# Patient Record
Sex: Male | Born: 1985
Health system: Southern US, Community
[De-identification: ages and names within clinical notes are randomized; demographics above are authoritative.]

---

## 2010-06-25 ENCOUNTER — Emergency Department (HOSPITAL_COMMUNITY): Admission: EM | Admit: 2010-06-25 | Discharge: 2010-06-25 | Payer: Self-pay | Admitting: Emergency Medicine

## 2011-02-22 LAB — POCT I-STAT, CHEM 8
BUN: 9 mg/dL (ref 6–23)
Calcium, Ion: 1.09 mmol/L — ABNORMAL LOW (ref 1.12–1.32)
Chloride: 104 meq/L (ref 96–112)
Creatinine, Ser: 1.1 mg/dL (ref 0.4–1.5)
Glucose, Bld: 103 mg/dL — ABNORMAL HIGH (ref 70–99)
HCT: 46 % (ref 39.0–52.0)
Hemoglobin: 15.6 g/dL (ref 13.0–17.0)
Potassium: 2.7 meq/L — CL (ref 3.5–5.1)
Sodium: 140 meq/L (ref 135–145)
TCO2: 24 mmol/L (ref 0–100)

## 2011-02-22 LAB — POTASSIUM: Potassium: 3.2 mEq/L — ABNORMAL LOW (ref 3.5–5.1)

## 2011-11-03 IMAGING — CR DG CHEST 2V
2 series · 2 of 2 positions shown · non-contrast
Comparison: None.

CLINICAL DATA: Shortness of breath.  Cardiac palpitations.

CHEST - 2 VIEW

[w chest pa]
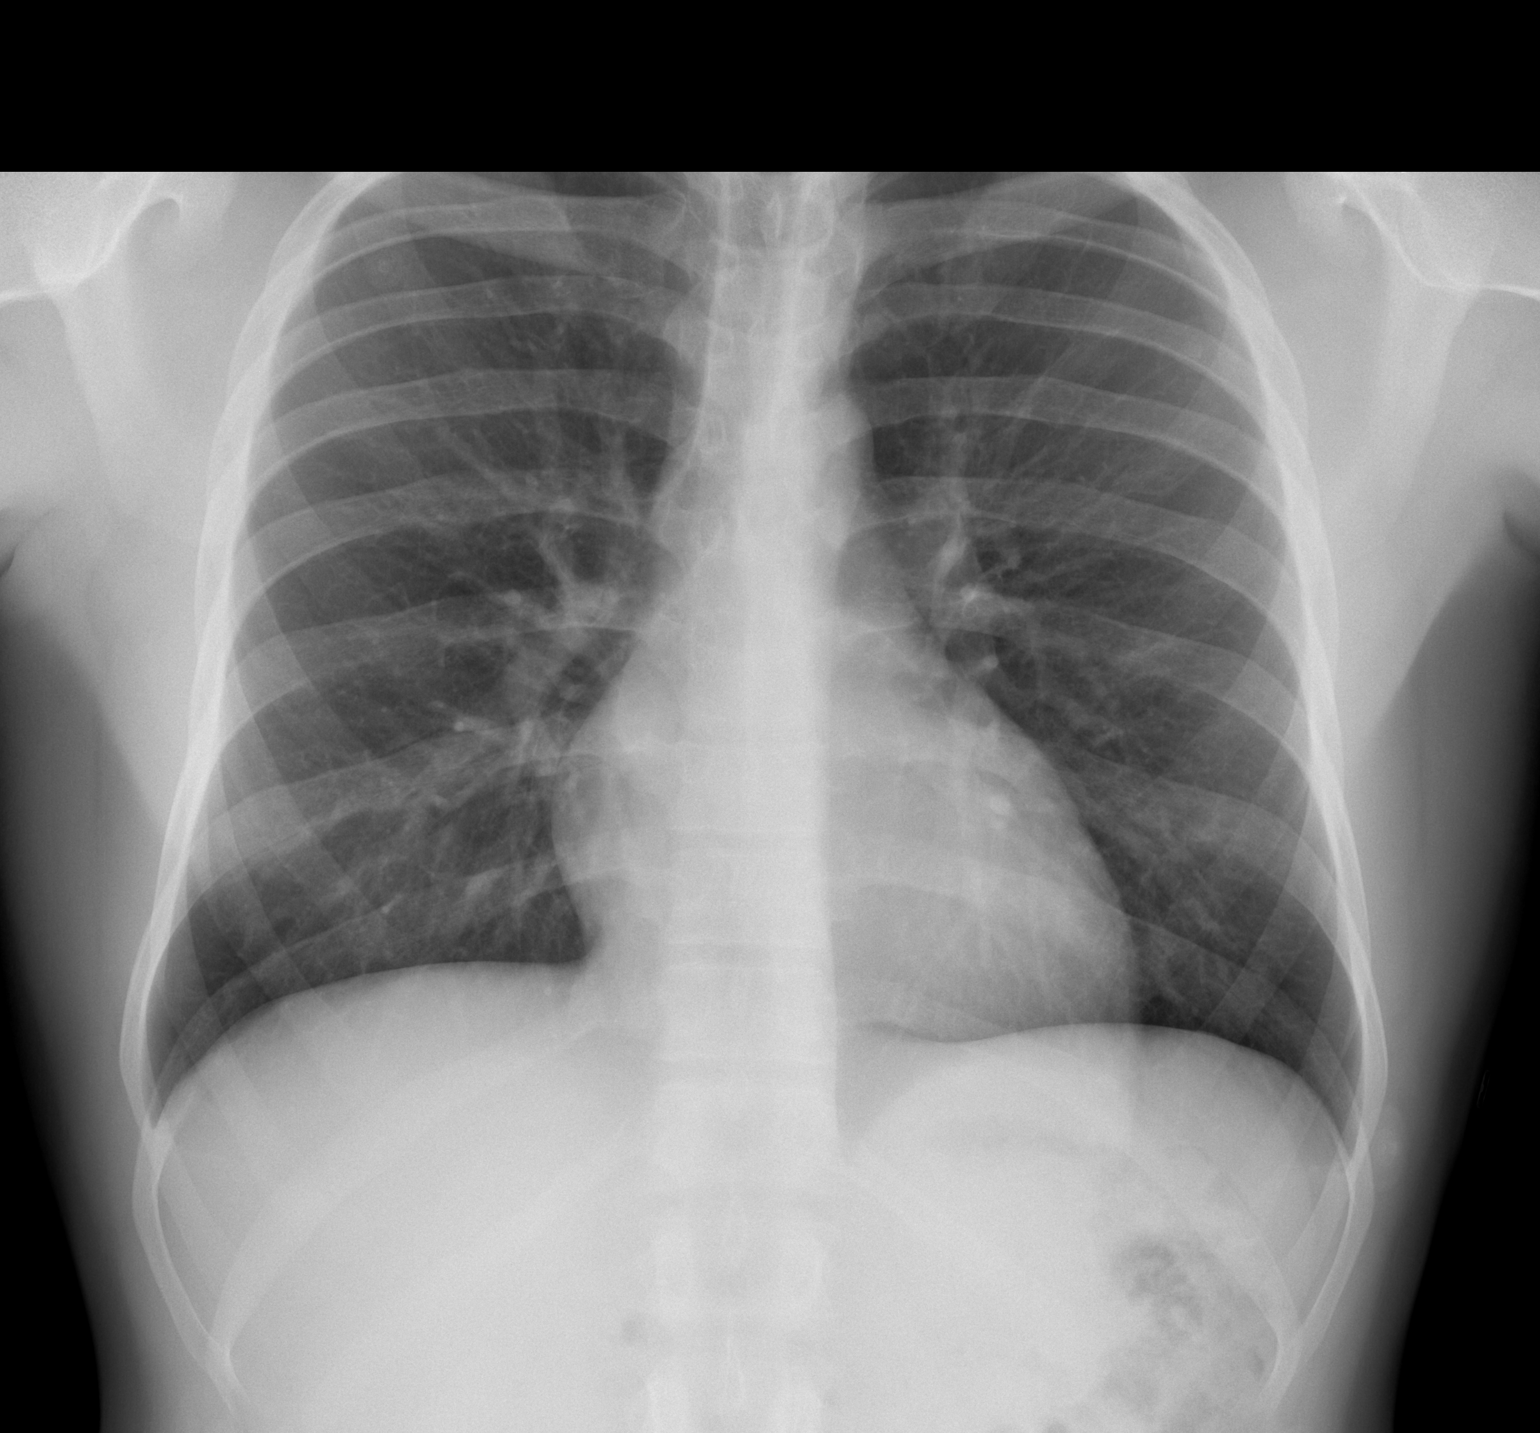

[w chest lat]
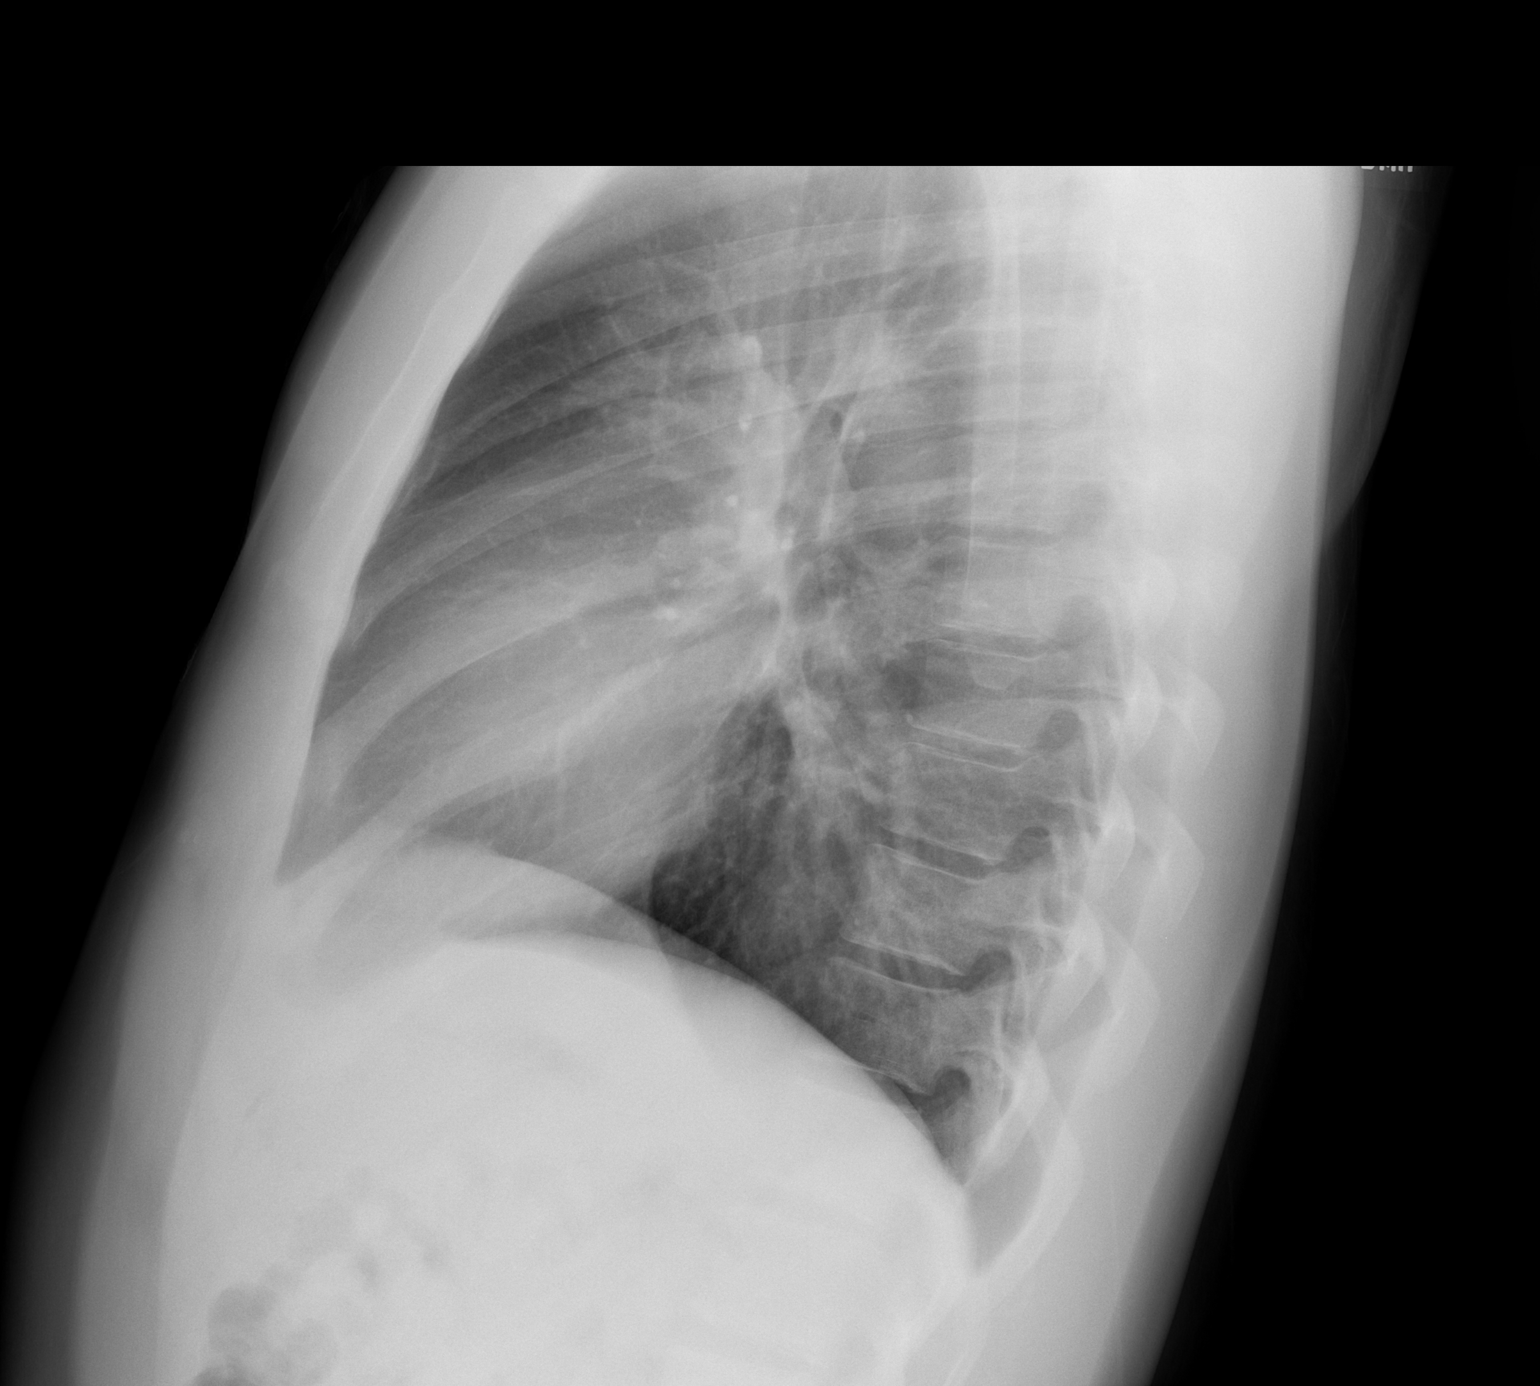

[2 of 2 positions shown; findings below may reference images not displayed]

FINDINGS: Cardiac and mediastinal contours appear normal.

The lungs appear clear.

No pleural effusion is identified.
IMPRESSION: No significant abnormality identified.

## 2013-03-19 ENCOUNTER — Emergency Department (HOSPITAL_COMMUNITY)
Admission: EM | Admit: 2013-03-19 | Discharge: 2013-03-19 | Disposition: A | Discharge status: Home / Self Care | Payer: No Typology Code available for payment source | Attending: Emergency Medicine | Admitting: Emergency Medicine

## 2013-03-19 ENCOUNTER — Emergency Department (HOSPITAL_COMMUNITY): Payer: No Typology Code available for payment source

## 2013-03-19 DIAGNOSIS — Y92410 Unspecified street and highway as the place of occurrence of the external cause: Secondary | ICD-10-CM | POA: Insufficient documentation

## 2013-03-19 DIAGNOSIS — Y9389 Activity, other specified: Secondary | ICD-10-CM | POA: Insufficient documentation

## 2013-03-19 DIAGNOSIS — S298XXA Other specified injuries of thorax, initial encounter: Secondary | ICD-10-CM | POA: Insufficient documentation

## 2013-03-19 DIAGNOSIS — S0993XA Unspecified injury of face, initial encounter: Secondary | ICD-10-CM | POA: Insufficient documentation

## 2013-03-19 MED ORDER — METHOCARBAMOL 500 MG PO TABS: 500.0000 mg | ORAL_TABLET | Freq: Two times a day (BID) | ORAL | Status: DC

## 2013-03-19 MED ORDER — IBUPROFEN 600 MG PO TABS: 600.0000 mg | ORAL_TABLET | Freq: Four times a day (QID) | ORAL | Status: DC | PRN

## 2013-03-19 NOTE — ED Provider Notes (Signed)
Medical screening examination/treatment/procedure(s) were performed by non-physician practitioner and as supervising physician I was immediately available for consultation/collaboration.   Glynn Octave, MD 03/19/13 2249
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
## 2018-04-26 ENCOUNTER — Encounter (HOSPITAL_COMMUNITY): Payer: Self-pay | Admitting: Emergency Medicine

## 2018-04-26 ENCOUNTER — Other Ambulatory Visit: Payer: Self-pay

## 2018-04-26 DIAGNOSIS — Z79899 Other long term (current) drug therapy: Secondary | ICD-10-CM | POA: Diagnosis not present

## 2018-04-26 DIAGNOSIS — M62838 Other muscle spasm: Secondary | ICD-10-CM | POA: Diagnosis not present

## 2018-04-26 DIAGNOSIS — R200 Anesthesia of skin: Secondary | ICD-10-CM | POA: Diagnosis not present

## 2018-04-26 DIAGNOSIS — R202 Paresthesia of skin: Secondary | ICD-10-CM | POA: Diagnosis not present

## 2018-04-26 NOTE — ED Triage Notes (Signed)
Pt is c/o muscle twitching for a month  And tingling in his hands and feet for the past two weeks   Pt states a little over a month ago he had a sharp pain in his back like a pinched nerve so he took ibuprofen and the pain went away but he continues to have twitching in that area

## 2018-04-27 ENCOUNTER — Emergency Department (HOSPITAL_COMMUNITY)
Admission: EM | Admit: 2018-04-27 | Discharge: 2018-04-27 | Disposition: A | Discharge status: Home / Self Care | Payer: 59 | Attending: Emergency Medicine | Admitting: Emergency Medicine

## 2018-04-27 DIAGNOSIS — R202 Paresthesia of skin: Secondary | ICD-10-CM

## 2018-04-27 DIAGNOSIS — M62838 Other muscle spasm: Secondary | ICD-10-CM

## 2018-04-27 LAB — I-STAT CHEM 8, ED
BUN: 11 mg/dL (ref 6–20)
CALCIUM ION: 1.22 mmol/L (ref 1.15–1.40)
Chloride: 103 mmol/L (ref 101–111)
Creatinine, Ser: 0.9 mg/dL (ref 0.61–1.24)
GLUCOSE: 96 mg/dL (ref 65–99)
HCT: 44 % (ref 39.0–52.0)
HEMOGLOBIN: 15 g/dL (ref 13.0–17.0)
Potassium: 3.5 mmol/L (ref 3.5–5.1)
SODIUM: 141 mmol/L (ref 135–145)
TCO2: 25 mmol/L (ref 22–32)

## 2018-04-27 MED ORDER — POTASSIUM CHLORIDE CRYS ER 20 MEQ PO TBCR
40.0000 meq | EXTENDED_RELEASE_TABLET | Freq: Once | ORAL | Status: AC
  Administered 2018-04-27: 40 meq via ORAL
  Filled 2018-04-27: qty 2

## 2018-04-27 NOTE — ED Notes (Signed)
Patient states he started having twitches about two months ago. Patient states it has been having everyday since then.

## 2018-04-27 NOTE — ED Provider Notes (Signed)
[facility]-EMERGENCY DEPT Provider Note   CSN: [id number] Arrival date & time: 04/26/18  2114     History   Chief Complaint Chief Complaint  Patient presents with  . Spasms    HPI Shawn Lewis is a 32 y.o. male.  Patient presents to the emergency department with a chief complaint of muscle spasms/twitches as well as some tingling in his hands and feet which is been ongoing for about 2 months.  He denies any injuries.  Denies any neck or back pain.  He works as an Publishing copy.  He denies any fevers, chills.  Denies any weakness.  He denies any medication or drug use.  Denies any other associated symptoms.  The history is provided by the patient. No language interpreter was used.    History reviewed. No pertinent past medical history.  There are no active problems to display for this patient.   History reviewed. No pertinent surgical history.      Home Medications    Prior to Admission medications   Medication Sig Start Date End Date Taking? Authorizing Provider  ibuprofen (ADVIL,MOTRIN) 600 MG tablet Take 1 tablet (600 mg total) by mouth every 6 (six) hours as needed for pain. 03/19/13   Roxy Horseman, PA-C  methocarbamol (ROBAXIN) 500 MG tablet Take 1 tablet (500 mg total) by mouth 2 (two) times daily. 03/19/13   Roxy Horseman, PA-C    Family History Family History  Problem Relation Age of Onset  . Diabetes Other     Social History Social History   Tobacco Use  . Smoking status: Never Smoker  . Smokeless tobacco: Never Used  Substance Use Topics  . Alcohol use: Yes    Comment: occ  . Drug use: Not Currently    Types: Marijuana    Comment: last used a month ago     Allergies   Patient has no known allergies.   Review of Systems Review of Systems  All other systems reviewed and are negative.    Physical Exam Updated Vital Signs BP 128/70 (BP Location: Left Arm)   Pulse (!) 57   Temp 98.1 F (36.7 C) (Oral)    Resp 16   Ht  (1.727 m)   Wt 72.6 kg (160 lb)   SpO2 100%   BMI 24.33 kg/m   Physical Exam  Constitutional: He is oriented to person, place, and time. He appears well-developed and well-nourished.  HENT:  Head: Normocephalic and atraumatic.  Eyes: Pupils are equal, round, and reactive to light. Conjunctivae and EOM are normal. Right eye exhibits no discharge. Left eye exhibits no discharge. No scleral icterus.  Neck: Normal range of motion. Neck supple. No JVD present.  Cardiovascular: Normal rate, regular rhythm and normal heart sounds. Exam reveals no gallop and no friction rub.  No murmur heard. Pulmonary/Chest: Effort normal and breath sounds normal. No respiratory distress. He has no wheezes. He has no rales. He exhibits no tenderness.  Abdominal: Soft. He exhibits no distension and no mass. There is no tenderness. There is no rebound and no guarding.  Musculoskeletal: Normal range of motion. He exhibits no edema or tenderness.  Neurological: He is alert and oriented to person, place, and time.  Skin: Skin is warm and dry.  Psychiatric: He has a normal mood and affect. His behavior is normal. Judgment and thought content normal.  Nursing note and vitals reviewed.    ED Treatments / Results  Labs (all labs ordered are listed, but only  abnormal results are displayed) Labs Reviewed  I-STAT CHEM 8, ED    EKG None  Radiology No results found.  Procedures Procedures (including critical care time)  Medications Ordered in ED Medications  potassium chloride SA (K-DUR,KLOR-CON) CR tablet 40 mEq (has no administration in time range)     Initial Impression / Assessment and Plan / ED Course  I have reviewed the triage vital signs and the nursing notes.  Pertinent labs & imaging results that were available during my care of the patient were reviewed by me and considered in my medical decision making (see chart for details).     Patient with subjective tingling in  his hands and feet as well as reported muscle twitches.  Chem-8 is unrevealing.  His potassium was on the lower end of normal, will give potassium supplement here.  Glucose is normal.  Recommend PCP follow-up.  Patient understands and agrees the plan.  Final Clinical Impressions(s) / ED Diagnoses   Final diagnoses:  Muscle spasm  Tingling in extremities    ED Discharge Orders    None       Roxy Horseman, PA-C 04/27/18 0300    Devoria Albe, MD 04/27/18 [phone]

## 2018-04-28 ENCOUNTER — Ambulatory Visit: Payer: Self-pay

## 2018-04-28 NOTE — Telephone Encounter (Signed)
Patient called in with c/o "tingling to hands and feet." He says "it started about 2 weeks ago, tingling to my hands and feet, off and on, more noticeable when I am off work relaxing. I also notice sweating to my hands and feet, not just the soles and palms, but on top as well. I don't have numbness." I asked about any other symptoms, he denies. According to protocol, see PCP within 3 days, appointment scheduled for tomorrow at 1140 with Wallis Bamberg, PA, care advice given, patient verbalized understanding.  Reason for Disposition . [1] Numbness or tingling on both sides of body AND [2] is a new symptom present > 24 hours  Answer Assessment - Initial Assessment Questions 1. SYMPTOM: "What is the main symptom you are concerned about?" (e.g., weakness, numbness)     Tingling of hands and feet 2. ONSET: "When did this start?" (minutes, hours, days; while sleeping)     2 weeks ago 3. LAST NORMAL: "When was the last time you were normal (no symptoms)?"     2 weeks ago 4. PATTERN "Does this come and go, or has it been constant since it started?"  "Is it present now?"     Off and on; present now in my feet, not bad 5. CARDIAC SYMPTOMS: "Have you had any of the following symptoms: chest pain, difficulty breathing, palpitations?"     No 6. NEUROLOGIC SYMPTOMS: "Have you had any of the following symptoms: headache, dizziness, vision loss, double vision, changes in speech, unsteady on your feet?"     No 7. OTHER SYMPTOMS: "Do you have any other symptoms?"     Sweating to bottom and top of hands and feet 8. PREGNANCY: "Is there any chance you are pregnant?" "When was your last menstrual period?"     N/A  Protocols used: NEUROLOGIC DEFICIT-A-AH

## 2018-04-29 ENCOUNTER — Ambulatory Visit (INDEPENDENT_AMBULATORY_CARE_PROVIDER_SITE_OTHER): Payer: 59 | Admitting: Urgent Care

## 2018-04-29 ENCOUNTER — Other Ambulatory Visit: Payer: Self-pay

## 2018-04-29 ENCOUNTER — Encounter: Payer: Self-pay | Admitting: Urgent Care

## 2018-04-29 VITALS — BP 132/78 | HR 89 | Temp 98.9°F | Resp 16 | Ht 68.0 in | Wt 157.0 lb

## 2018-04-29 DIAGNOSIS — R202 Paresthesia of skin: Secondary | ICD-10-CM | POA: Diagnosis not present

## 2018-04-29 NOTE — Progress Notes (Signed)
   MRN: [id number] DOB: 04/14/1986  Subjective:   Shawn Lewis is a 32 y.o. male presenting for 2 week history of tingling of hands and feet, 1 month history of muscle spasms of forearms mostly but has also had symptoms in his upper arms and thighs occasionally. Has 1-2 drinks per month. Drinks ~1 liter of water per day. Occasionally drinks tea. Patient uses muscle milk as well with creatine. Patient was exercising regularly but stopped due to his symptoms.  He has a 17-year-old daughter admits that he has not been getting good sleep due to and busy with his daughter.  Otherwise he eats pretty regular meals.  Shawn Lewis is not currently taking any medications. Also has No Known Allergies.  Val denies past medical and surgical history. Mother has a history of diabetes.   Objective:   Vitals: BP 132/78   Pulse 89   Temp 98.9 F (37.2 C) (Oral)   Resp 16   Ht  (1.727 m)   Wt 157 lb (71.2 kg)   SpO2 99%   BMI 23.87 kg/m   Physical Exam  Constitutional: He is oriented to person, place, and time. He appears well-developed and well-nourished.  Cardiovascular: Normal rate.  Pulmonary/Chest: Effort normal.  Neurological: He is alert and oriented to person, place, and time.  Psychiatric: He has a normal mood and affect.   Assessment and Plan :   Paresthesia of both hands - Plan: Hemoglobin A1c, Thyroid Panel With TSH  Tingling of both feet - Plan: Hemoglobin A1c, Thyroid Panel With TSH  Patient was recently in the ER for the same symptoms, had a normal i-STAT.  I reviewed this with patient.  Labs are pending for today.  Counseled that he needs more hydration.  Also recommended that he figure out a way that he could get more consistent sleep.  He is also to stop using creatine.  Follow-up with lab results.  Shawn Bamberg, PA-C Primary Care at [facility] [phone] 04/29/2018  12:04 PM

## 2018-04-29 NOTE — Patient Instructions (Addendum)
Dehydration, Adult Dehydration is a condition in which there is not enough fluid or water in the body. This happens when you lose more fluids than you take in. Important organs, such as the kidneys, brain, and heart, cannot function without a proper amount of fluids. Any loss of fluids from the body can lead to dehydration. Dehydration can range from mild to severe. This condition should be treated right away to prevent it from becoming severe. What are the causes? This condition may be caused by:  Vomiting.  Diarrhea.  Excessive sweating, such as from heat exposure or exercise.  Not drinking enough fluid, especially: ? When ill. ? While doing activity that requires a lot of energy.  Excessive urination.  Fever.  Infection.  Certain medicines, such as medicines that cause the body to lose excess fluid (diuretics).  Inability to access safe drinking water.  Reduced physical ability to get adequate water and food.  What increases the risk? This condition is more likely to develop in people:  Who have a poorly controlled long-term (chronic) illness, such as diabetes, heart disease, or kidney disease.  Who are age 65 or older.  Who are disabled.  Who live in a place with high altitude.  Who play endurance sports.  What are the signs or symptoms? Symptoms of mild dehydration may include:  Thirst.  Dry lips.  Slightly dry mouth.  Dry, warm skin.  Dizziness. Symptoms of moderate dehydration may include:  Very dry mouth.  Muscle cramps.  Dark urine. Urine may be the color of tea.  Decreased urine production.  Decreased tear production.  Heartbeat that is irregular or faster than normal (palpitations).  Headache.  Light-headedness, especially when you stand up from a sitting position.  Fainting (syncope). Symptoms of severe dehydration may include:  Changes in skin, such as: ? Cold and clammy skin. ? Blotchy (mottled) or pale skin. ? Skin that does  not quickly return to normal after being lightly pinched and released (poor skin turgor).  Changes in body fluids, such as: ? Extreme thirst. ? No tear production. ? Inability to sweat when body temperature is high, such as in hot weather. ? Very little urine production.  Changes in vital signs, such as: ? Weak pulse. ? Pulse that is more than 100 beats a minute when sitting still. ? Rapid breathing. ? Low blood pressure.  Other changes, such as: ? Sunken eyes. ? Cold hands and feet. ? Confusion. ? Lack of energy (lethargy). ? Difficulty waking up from sleep. ? Short-term weight loss. ? Unconsciousness. How is this diagnosed? This condition is diagnosed based on your symptoms and a physical exam. Blood and urine tests may be done to help confirm the diagnosis. How is this treated? Treatment for this condition depends on the severity. Mild or moderate dehydration can often be treated at home. Treatment should be started right away. Do not wait until dehydration becomes severe. Severe dehydration is an emergency and it needs to be treated in a hospital. Treatment for mild dehydration may include:  Drinking more fluids.  Replacing salts and minerals in your blood (electrolytes) that you may have lost. Treatment for moderate dehydration may include:  Drinking an oral rehydration solution (ORS). This is a drink that helps you replace fluids and electrolytes (rehydrate). It can be found at pharmacies and retail stores. Treatment for severe dehydration may include:  Receiving fluids through an IV tube.  Receiving an electrolyte solution through a feeding tube that is passed through your nose   and into your stomach (nasogastric tube, or NG tube).  Correcting any abnormalities in electrolytes.  Treating the underlying cause of dehydration. Follow these instructions at home:  If directed by your health care provider, drink an ORS: ? Make an ORS by following instructions on the  package. ? Start by drinking small amounts, about  cup (120 mL) every 5-10 minutes. ? Slowly increase how much you drink until you have taken the amount recommended by your health care provider.  Drink enough clear fluid to keep your urine clear or pale yellow. If you were told to drink an ORS, finish the ORS first, then start slowly drinking other clear fluids. Drink fluids such as: ? Water. Do not drink only water. Doing that can lead to having too little salt (sodium) in the body (hyponatremia). ? Ice chips. ? Fruit juice that you have added water to (diluted fruit juice). ? Low-calorie sports drinks.  Avoid: ? Alcohol. ? Drinks that contain a lot of sugar. These include high-calorie sports drinks, fruit juice that is not diluted, and soda. ? Caffeine. ? Foods that are greasy or contain a lot of fat or sugar.  Take over-the-counter and prescription medicines only as told by your health care provider.  Do not take sodium tablets. This can lead to having too much sodium in the body (hypernatremia).  Eat foods that contain a healthy balance of electrolytes, such as bananas, oranges, potatoes, tomatoes, and spinach.  Keep all follow-up visits as told by your health care provider. This is important. Contact a health care provider if:  You have abdominal pain that: ? Gets worse. ? Stays in one area (localizes).  You have a rash.  You have a stiff neck.  You are more irritable than usual.  You are sleepier or more difficult to wake up than usual.  You feel weak or dizzy.  You feel very thirsty.  You have urinated only a small amount of very dark urine over 6-8 hours. Get help right away if:  You have symptoms of severe dehydration.  You cannot drink fluids without vomiting.  Your symptoms get worse with treatment.  You have a fever.  You have a severe headache.  You have vomiting or diarrhea that: ? Gets worse. ? Does not go away.  You have blood or green matter  (bile) in your vomit.  You have blood in your stool. This may cause stool to look black and tarry.  You have not urinated in 6-8 hours.  You faint.  Your heart rate while sitting still is over 100 beats a minute.  You have trouble breathing. This information is not intended to replace advice given to you by your health care provider. Make sure you discuss any questions you have with your health care provider. Document Released: 11/24/2005 Document Revised: 06/20/2016 Document Reviewed: 01/18/2016 Elsevier Interactive Patient Education  2018 [employer]     IF you received an x-ray today, you will receive an invoice from [facility]. Please contact [facility] at [phone] with questions or concerns regarding your invoice.   IF you received labwork today, you will receive an invoice from [facility]. Please contact [facility] at [phone] with questions or concerns regarding your invoice.   Our billing staff will not be able to assist you with questions regarding bills from these companies.  You will be contacted with the lab results as soon as they are available. The fastest way to get your results is to activate your My Chart account.   Instructions are located on the last page of this paperwork. If you have not heard from us regarding the results in 2 weeks, please contact this office.      

## 2018-04-30 LAB — HEMOGLOBIN A1C
ESTIMATED AVERAGE GLUCOSE: 111 mg/dL
Hgb A1c MFr Bld: 5.5 % (ref 4.8–5.6)

## 2018-04-30 LAB — THYROID PANEL WITH TSH
FREE THYROXINE INDEX: 2.2 (ref 1.2–4.9)
T3 UPTAKE RATIO: 28 % (ref 24–39)
T4 TOTAL: 7.7 ug/dL (ref 4.5–12.0)
TSH: 1.67 u[IU]/mL (ref 0.450–4.500)

## 2018-05-04 ENCOUNTER — Encounter: Payer: Self-pay | Admitting: *Deleted

## 2024-09-02 ENCOUNTER — Ambulatory Visit (INDEPENDENT_AMBULATORY_CARE_PROVIDER_SITE_OTHER): Admitting: Family Medicine

## 2024-09-02 ENCOUNTER — Encounter: Payer: Self-pay | Admitting: Family Medicine

## 2024-09-02 VITALS — BP 126/73 | HR 76 | Temp 97.5°F | Resp 18 | Ht 67.0 in | Wt 166.8 lb

## 2024-09-02 DIAGNOSIS — R361 Hematospermia: Secondary | ICD-10-CM | POA: Diagnosis not present

## 2024-09-02 NOTE — Progress Notes (Signed)
 Assessment & Plan   Assessment/Plan:    Assessment & Plan Hematospermia Intermittent hematospermia, likely due to minor trauma from recent use of a tight sex toy. No associated pain, testicular pain, or urinary symptoms. No signs of infection or prostatitis. Differential includes benign idiopathic hematospermia or trauma-induced bleeding. Most cases in men under 40 are benign and self-limiting, with the majority resolving spontaneously. - Monitor symptoms for two weeks. - Perform regular self-testicular exams. - Maintain a journal of occurrences and symptoms. - Follow-up in two weeks, fasting for potential blood work. - Request and review urgent care records. - Discuss condition with spouse and provide reassurance about its benign nature. - Provide literature on hematospermia for further reading. - Offer referral to urology if symptoms persist or worsen.      There are no discontinued medications.  No follow-ups on file.        Subjective:   Encounter date: 09/02/2024  Shawn Lewis is a 38 y.o. male who does not have a problem list on file.SABRA   He  has no past medical history on file.SABRA   He presents with chief complaint of Establish Care (Pt is fasting today //HM due- vaccinations (patient declined for today) ) and semen (Pt c/o of blood in semen for 1 week. Pt went to UC for symptoms, no pain or discomfort is present ) .   Discussed the use of AI scribe software for clinical note transcription with the patient, who gave verbal consent to proceed.  History of Present Illness Shawn Lewis is a 38 year old male who presents with hematospermia.  Hematospermia - Painless hematospermia first noticed during ejaculation after intercourse - Blood observed in semen during ejaculation and in subsequent urination - No testicular pain, genital pain, or bleeding outside of ejaculation - No blood in urine outside of the episode associated with ejaculation - No fever, chills,  abdominal pain, nausea, vomiting, constipation, or changes in urination - No prior STD testing; denies extramarital sexual activity  Genitourinary trauma - Possible trauma from use of a sex toy that was 'a little tight' prior to onset of symptoms - Bleeding first observed on a Sunday after using the toy on the preceding Friday - Small amount of blood observed during ejaculation on a subsequent occasion, but not in urine  Previous evaluation - Evaluated at urgent care where urinalysis showed no infection or positive cultures - Advised to follow up with urology, but did not have a primary care provider at the time  Musculoskeletal pain - 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 No pertinent surgical history.  Outpatient Medications Prior to Visit  Medication Sig Dispense Refill   cyclobenzaprine (FLEXERIL) 10 MG tablet Take 5 mg by mouth. (Patient not taking: Reported on 09/02/2024)     lidocaine (LIDODERM) 5 % Apply 1 patch topically. (Patient not taking: Reported on 09/02/2024)     No facility-administered medications prior to visit.    Family History  Problem Relation Age of Onset   Diabetes Other    Diabetes Mother     Social History   Socioeconomic History   Marital status: Single    Spouse name: Not on file   Number of children: Not on file   Years of education:  Not on file   Highest education level: Not on file  Occupational History   Not on file  Tobacco Use   Smoking status: Former   Smokeless tobacco: Never   Tobacco comments:    Patient was smoking for 3 months   Vaping Use   Vaping status: Some Days  Substance and Sexual Activity   Alcohol use: Yes    Comment: occ   Drug use: Not Currently    Types: Marijuana     Comment: last used a month ago   Sexual activity: Yes    Birth control/protection: None  Other Topics Concern   Not on file  Social History Narrative   Not on file   Social Drivers of Health   Financial Resource Strain: Not on file  Food Insecurity: Not on file  Transportation Needs: Not on file  Physical Activity: Not on file  Stress: Not on file  Social Connections: Not on file  Intimate Partner Violence: Not on file                                                                                                  Objective:  Physical Exam: BP 126/73 (BP Location: Left Arm, Patient Position: Sitting, Cuff Size: Large)   Pulse 76   Temp (!) 97.5 F (36.4 C) (Temporal)   Resp 18   Ht 5' 7 (1.702 m)   Wt 166 lb 12.8 oz (75.7 kg)   SpO2 98%   BMI 26.12 kg/m    Physical Exam GENERAL: Alert, cooperative, well developed, no acute distress HEENT: Normocephalic, normal oropharynx, moist mucous membranes CHEST: Clear to auscultation bilaterally, No wheezes, rhonchi, or crackles CARDIOVASCULAR: Normal heart rate and rhythm, S1 and S2 normal without murmurs ABDOMEN: Soft, non-tender, non-distended, without organomegaly, Normal bowel sounds EXTREMITIES: No cyanosis or edema GU: Deferred per patient comfort NEUROLOGICAL: Cranial nerves grossly intact, Moves all extremities without gross motor or sensory deficit   Physical Exam  No results found.  No results found for this or any previous visit (from the past 2160 hours).      Beverley Adine Hummer, MD, MS

## 2024-09-04 DIAGNOSIS — R361 Hematospermia: Secondary | ICD-10-CM | POA: Insufficient documentation

## 2024-09-16 ENCOUNTER — Encounter: Payer: Self-pay | Admitting: Family Medicine

## 2024-09-16 ENCOUNTER — Ambulatory Visit

## 2024-09-16 ENCOUNTER — Ambulatory Visit: Admitting: Family Medicine

## 2024-09-16 ENCOUNTER — Ambulatory Visit (INDEPENDENT_AMBULATORY_CARE_PROVIDER_SITE_OTHER)

## 2024-09-16 VITALS — BP 128/80 | HR 65 | Temp 97.1°F | Resp 18 | Wt 170.2 lb

## 2024-09-16 DIAGNOSIS — Z0001 Encounter for general adult medical examination with abnormal findings: Secondary | ICD-10-CM | POA: Diagnosis not present

## 2024-09-16 DIAGNOSIS — M545 Low back pain, unspecified: Secondary | ICD-10-CM

## 2024-09-16 DIAGNOSIS — R361 Hematospermia: Secondary | ICD-10-CM

## 2024-09-16 DIAGNOSIS — M546 Pain in thoracic spine: Secondary | ICD-10-CM | POA: Diagnosis not present

## 2024-09-16 DIAGNOSIS — G8929 Other chronic pain: Secondary | ICD-10-CM

## 2024-09-16 DIAGNOSIS — Z0000 Encounter for general adult medical examination without abnormal findings: Secondary | ICD-10-CM

## 2024-09-16 DIAGNOSIS — Z136 Encounter for screening for cardiovascular disorders: Secondary | ICD-10-CM

## 2024-09-16 DIAGNOSIS — Z130 Encounter for screening for diseases of the blood and blood-forming organs and certain disorders involving the immune mechanism: Secondary | ICD-10-CM

## 2024-09-16 DIAGNOSIS — Z1329 Encounter for screening for other suspected endocrine disorder: Secondary | ICD-10-CM

## 2024-09-16 LAB — COMPREHENSIVE METABOLIC PANEL WITH GFR
ALT: 27 U/L (ref 0–53)
AST: 16 U/L (ref 0–37)
Albumin: 4.5 g/dL (ref 3.5–5.2)
Alkaline Phosphatase: 43 U/L (ref 39–117)
BUN: 7 mg/dL (ref 6–23)
CO2: 27 meq/L (ref 19–32)
Calcium: 9.1 mg/dL (ref 8.4–10.5)
Chloride: 104 meq/L (ref 96–112)
Creatinine, Ser: 0.85 mg/dL (ref 0.40–1.50)
GFR: 111.19 mL/min (ref >60.00)
Glucose, Bld: 79 mg/dL (ref 70–99)
Potassium: 4.1 meq/L (ref 3.5–5.1)
Sodium: 139 meq/L (ref 135–145)
Total Bilirubin: 0.4 mg/dL (ref 0.2–1.2)
Total Protein: 7 g/dL (ref 6.0–8.3)

## 2024-09-16 LAB — LIPID PANEL
Cholesterol: 156 mg/dL (ref 0–200)
HDL: 52.5 mg/dL (ref >39.00)
LDL Cholesterol: 94 mg/dL (ref 0–99)
NonHDL: 103.49
Total CHOL/HDL Ratio: 3
Triglycerides: 45 mg/dL (ref 0.0–149.0)
VLDL: 9 mg/dL (ref 0.0–40.0)

## 2024-09-16 LAB — CBC WITH DIFFERENTIAL/PLATELET
Basophils Absolute: 0 K/uL (ref 0.0–0.1)
Basophils Relative: 0.5 % (ref 0.0–3.0)
Eosinophils Absolute: 0.1 K/uL (ref 0.0–0.7)
Eosinophils Relative: 2.5 % (ref 0.0–5.0)
HCT: 43.2 % (ref 39.0–52.0)
Hemoglobin: 14.2 g/dL (ref 13.0–17.0)
Lymphocytes Relative: 28.1 % (ref 12.0–46.0)
Lymphs Abs: 1.2 K/uL (ref 0.7–4.0)
MCHC: 32.9 g/dL (ref 30.0–36.0)
MCV: 90.7 fl (ref 78.0–100.0)
Monocytes Absolute: 0.5 K/uL (ref 0.1–1.0)
Monocytes Relative: 11.7 % (ref 3.0–12.0)
Neutro Abs: 2.5 K/uL (ref 1.4–7.7)
Neutrophils Relative %: 57.2 % (ref 43.0–77.0)
Platelets: 230 K/uL (ref 150.0–400.0)
RBC: 4.77 Mil/uL (ref 4.22–5.81)
RDW: 14.1 % (ref 11.5–15.5)
WBC: 4.4 K/uL (ref 4.0–10.5)

## 2024-09-16 LAB — TSH: TSH: 5.32 u[IU]/mL (ref 0.35–5.50)

## 2024-09-16 LAB — HEMOGLOBIN A1C: Hgb A1c MFr Bld: 5.8 % (ref 4.6–6.5)

## 2024-09-16 LAB — PSA: PSA: 0.52 ng/mL (ref 0.10–4.00)

## 2024-09-16 NOTE — Patient Instructions (Addendum)
 It was very nice to see you today!  Today you were seen for your physical. We are getting x-rays of the back.  As well as blood work to assess what was discussed today.  Attached are exercises to do for your back.  All yeah yeah thank please    Return if symptoms worsen or fail to improve.   Take care, Arvella Hummer, MD, MS   PLEASE NOTE:  If you had any lab tests, please let us  know if you have not heard back within a few days. You may see your results on mychart before we have a chance to review them but we will give you a call once they are reviewed by us .   If we ordered any referrals today, please let us  know if you have not heard from their office within the next week.   If you had any urgent prescriptions sent in today, please check with the pharmacy within an hour of our visit to make sure the prescription was transmitted appropriately.   Please try these tips to maintain a healthy lifestyle:  Eat at least 3 REAL meals and 1-2 snacks per day.  Aim for no more than 5 hours between eating.  If you eat breakfast, please do so within one hour of getting up.   Each meal should contain half fruits/vegetables, one quarter protein, and one quarter carbs (no bigger than a computer mouse)  Cut down on sweet beverages. This includes juice, soda, and sweet tea.   Drink at least 1 glass of water with each meal and aim for at least 8 glasses per day  Exercise at least 150 minutes every week.

## 2024-09-17 ENCOUNTER — Ambulatory Visit: Payer: Self-pay | Admitting: Family Medicine

## 2024-09-17 ENCOUNTER — Encounter: Payer: Self-pay | Admitting: Family Medicine

## 2024-09-17 DIAGNOSIS — Z0000 Encounter for general adult medical examination without abnormal findings: Secondary | ICD-10-CM | POA: Insufficient documentation

## 2024-09-17 DIAGNOSIS — G8929 Other chronic pain: Secondary | ICD-10-CM | POA: Insufficient documentation

## 2024-09-17 LAB — URINALYSIS W MICROSCOPIC + REFLEX CULTURE
Bacteria, UA: NONE SEEN /HPF
Bilirubin Urine: NEGATIVE
Glucose, UA: NEGATIVE
Hgb urine dipstick: NEGATIVE
Hyaline Cast: NONE SEEN /LPF
Ketones, ur: NEGATIVE
Leukocyte Esterase: NEGATIVE
Nitrites, Initial: NEGATIVE
Protein, ur: NEGATIVE
RBC / HPF: NONE SEEN /HPF (ref 0–2)
Specific Gravity, Urine: 1.021 (ref 1.001–1.035)
Squamous Epithelial / HPF: NONE SEEN /HPF (ref <=5)
WBC, UA: NONE SEEN /HPF (ref 0–5)
pH: 5.5 (ref 5.0–8.0)

## 2024-09-17 LAB — NO CULTURE INDICATED

## 2024-09-17 NOTE — Assessment & Plan Note (Signed)
 Assess causes of possible thyroid , prostatitis, UTI Assess for anemia CBC

## 2024-09-17 NOTE — Progress Notes (Signed)
 Assessment  Assessment/Plan:   Problem List Items Addressed This Visit     Hematospermia   Assess causes of possible thyroid , prostatitis, UTI Assess for anemia CBC      Relevant Orders   TSH (Completed)   CBC with Differential/Platelet (Completed)   Comprehensive metabolic panel with GFR (Completed)   Urinalysis w microscopic + reflex cultur (Completed)   PSA (Completed)   Encounter for well adult exam without abnormal findings - Primary   Screen for heart disease with lipid panel Screen for diabetes Screen for renal dysfunction Screen liver function      Relevant Orders   Lipid panel (Completed)   Hemoglobin A1c (Completed)   CBC with Differential/Platelet (Completed)   Comprehensive metabolic panel with GFR (Completed)   Urinalysis w microscopic + reflex cultur (Completed)   Chronic midline low back pain without sciatica   Likely postural However patient is African-American which does place him at higher risk of prostate cancer With his hematospermia, we will rule out any type of prostate dysfunction Obtain imaging of lumbar and thoracic back to assess any spinal abnormalities or any bony lesions      Relevant Orders   DG Lumbar Spine Complete   Chronic midline thoracic back pain   Relevant Orders   DG Thoracic Spine W/Swimmers   Other Visit Diagnoses       Screening, heart disease, ischemic         Screening for endocrine, nutritional, metabolic and immunity disorder          There are no discontinued medications.  Patient Counseling(The following topics were reviewed and/or handout was given):  -Nutrition: Stressed importance of moderation in sodium/caffeine intake, saturated fat and cholesterol, caloric balance, sufficient intake of fresh fruits, vegetables, and fiber.  -Stressed the importance of regular exercise.   -Substance Abuse: Discussed cessation/primary prevention of tobacco, alcohol, or other drug use; driving or other dangerous activities  under the influence; availability of treatment for abuse.   -Injury prevention: Discussed safety belts, safety helmets, smoke detector, smoking near bedding or upholstery.   -Sexuality: Discussed sexually transmitted diseases, partner selection, use of condoms, avoidance of unintended pregnancy and contraceptive alternatives.   -Dental health: Discussed importance of regular tooth brushing, flossing, and dental visits.  -Health maintenance and immunizations reviewed. Please refer to Health maintenance section.  Return if symptoms worsen or fail to improve.        Subjective:   Encounter date: 09/16/2024  Chief Complaint  Patient presents with   Annual Exam    Pt is fasting today; no concerns   HM due- vaccinations (Pt declined)     Hemospermia Patient reports that since last visit he has had 1 recurrence.   But has not happened over the past several days during ejaculation.   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 He saw an urgent care about 1 year ago that did validate this Patient wears a back brace for comfort He does not take any OTC analgesics Pain is managed with stretching, icing, brace, heat Patient does have concern that it could be related to cancer He has no family history of prostate cancer, he denies fevers, chills, night sweats, abnormal weight loss      09/16/2024  11:08 AM 09/02/2024   11:05 AM 04/29/2018   11:44 AM  Depression screen PHQ 2/9  Decreased Interest 0 0 0  Down, Depressed, Hopeless 0 0 0  PHQ - 2 Score 0 0 0  Altered sleeping  0   Tired, decreased energy  0   Change in appetite  0   Feeling bad or failure about yourself   0   Trouble concentrating  0   Moving slowly or fidgety/restless   0   Suicidal thoughts  0   PHQ-9 Score  0   Difficult doing work/chores  Not difficult at all        09/02/2024   11:05 AM  GAD 7 : Generalized Anxiety Score  Nervous, Anxious, on Edge 2  Control/stop worrying 2  Worry too much - different things 0  Trouble relaxing 0  Restless 0  Easily annoyed or irritable 0  Afraid - awful might happen 2  Total GAD 7 Score 6  Anxiety Difficulty Not difficult at all    Health Maintenance Due  Topic Date Due   HIV Screening  Never done   Hepatitis C Screening  Never done       PMH:  The following were reviewed and entered/updated in epic: History reviewed. No pertinent past medical history.  Patient Active Problem List   Diagnosis Date Noted   Encounter for well adult exam without abnormal findings 09/17/2024   Chronic midline low back pain without sciatica 09/17/2024   Chronic midline thoracic back pain 09/17/2024   Hematospermia 09/04/2024    History reviewed. No pertinent surgical history.  Family History  Problem Relation Age of Onset   Diabetes Other    Diabetes Mother     Medications- reviewed and updated Outpatient Medications Prior to Visit  Medication Sig Dispense Refill   cyclobenzaprine (FLEXERIL) 10 MG tablet Take 5 mg by mouth. (Patient not taking: Reported on 09/16/2024)     lidocaine (LIDODERM) 5 % Apply 1 patch topically. (Patient not taking: Reported on 09/16/2024)     No facility-administered medications prior to visit.    No Known Allergies  Social History   Socioeconomic History   Marital status: Single    Spouse name: Not on file   Number of children: Not on file   Years of education: Not on file   Highest education level: Not on file  Occupational History   Not on file  Tobacco Use   Smoking status: Former   Smokeless tobacco: Never   Tobacco comments:    Patient was smoking for 3 months   Vaping Use   Vaping status: Some Days  Substance and Sexual Activity   Alcohol use: Yes     Comment: occ   Drug use: Not Currently    Types: Marijuana    Comment: last used a month ago   Sexual activity: Yes    Birth control/protection: None    Comment: Pt is married  Other Topics Concern   Not on file  Social History Narrative   Not on file   Social Drivers of Health   Financial Resource Strain: Not on file  Food Insecurity: Not on file  Transportation Needs: Not on file  Physical Activity: Not on file  Stress: Not on file  Social Connections: Not on file           Objective:  Physical Exam: BP 128/80 (BP Location: Left Arm, Patient Position: Sitting, Cuff Size: Large)   Pulse 65   Temp ROLLEN)  97.1 F (36.2 C) (Temporal)   Resp 18   Wt 170 lb 3.2 oz (77.2 kg)   SpO2 99%   BMI 26.66 kg/m   Body mass index is 26.66 kg/m. Wt Readings from Last 3 Encounters:  09/16/24 170 lb 3.2 oz (77.2 kg)  09/02/24 166 lb 12.8 oz (75.7 kg)  04/29/18 157 lb (71.2 kg)    Physical Exam Musculoskeletal:     Thoracic back: Spasms and tenderness present. Normal range of motion.     Lumbar back: Spasms and tenderness present. Normal range of motion. Negative right straight leg raise test and negative left straight leg raise test.     Comments: Lumbar back brace in place        At today's visit, we discussed treatment options, associated risk and benefits, and engage in counseling as needed.  Additionally the following were reviewed: Past medical records, past medical and surgical history, family and social background, as well as relevant laboratory results, imaging findings, and specialty notes, where applicable.  This message was generated using dictation software, and as a result, it may contain unintentional typos or errors.  Nevertheless, extensive effort was made to accurately convey at the pertinent aspects of the patient visit.    There may have been are other unrelated non-urgent complaints, but due to the busy schedule and the amount of time already spent with him,  time does not permit to address these issues at today's visit. Another appointment may have or has been requested to review these additional issues.     Arvella Hummer, MD, MS

## 2024-09-17 NOTE — Assessment & Plan Note (Signed)
 Likely postural However patient is African-American which does place him at higher risk of prostate cancer With his hematospermia, we will rule out any type of prostate dysfunction Obtain imaging of lumbar and thoracic back to assess any spinal abnormalities or any bony lesions

## 2024-09-17 NOTE — Assessment & Plan Note (Signed)
 Screen for heart disease with lipid panel Screen for diabetes Screen for renal dysfunction Screen liver function
